# Patient Record
Sex: Male | Born: 1935 | Race: White | Hispanic: No
Health system: Southern US, Community
[De-identification: ages and names within clinical notes are randomized; demographics above are authoritative.]

## PROBLEM LIST (undated history)

## (undated) ENCOUNTER — Emergency Department (HOSPITAL_COMMUNITY): Admission: EM | Payer: Self-pay | Source: Home / Self Care

## (undated) DIAGNOSIS — K223 Perforation of esophagus: Secondary | ICD-10-CM

## (undated) DIAGNOSIS — I4891 Unspecified atrial fibrillation: Secondary | ICD-10-CM

## (undated) DIAGNOSIS — E785 Hyperlipidemia, unspecified: Secondary | ICD-10-CM

## (undated) DIAGNOSIS — I10 Essential (primary) hypertension: Secondary | ICD-10-CM

## (undated) HISTORY — PX: ESOPHAGEAL ATRESIA REPAIR: SHX1525

## (undated) HISTORY — DX: Hyperlipidemia, unspecified: E78.5

## (undated) HISTORY — DX: Essential (primary) hypertension: I10

## (undated) HISTORY — DX: Unspecified atrial fibrillation: I48.91

## (undated) HISTORY — DX: Perforation of esophagus: K22.3

## (undated) HISTORY — PX: TONSILLECTOMY: SUR1361

---

## 1998-07-08 ENCOUNTER — Ambulatory Visit (HOSPITAL_BASED_OUTPATIENT_CLINIC_OR_DEPARTMENT_OTHER): Admission: RE | Admit: 1998-07-08 | Discharge: 1998-07-08 | Payer: Self-pay | Admitting: Plastic Surgery

## 2006-01-29 ENCOUNTER — Emergency Department (HOSPITAL_COMMUNITY): Admission: EM | Admit: 2006-01-29 | Discharge: 2006-01-29 | Payer: Self-pay | Admitting: Emergency Medicine

## 2007-03-12 IMAGING — CR DG FINGER RING 2+V*R*
3 series · 3 of 3 positions shown · non-contrast
Comparison: none

HISTORY: Nau no skull, lacerations to middle and ring fingers

RIGHT MIDDLE FINGER 3 VIEWS:
Mildly displaced fractures identified at volar and dorsal aspects at base of
middle phalanx right middle finger.
PIP and DIP joint alignments remain normal.
No additional fracture or dislocation.

[x finger pa right]
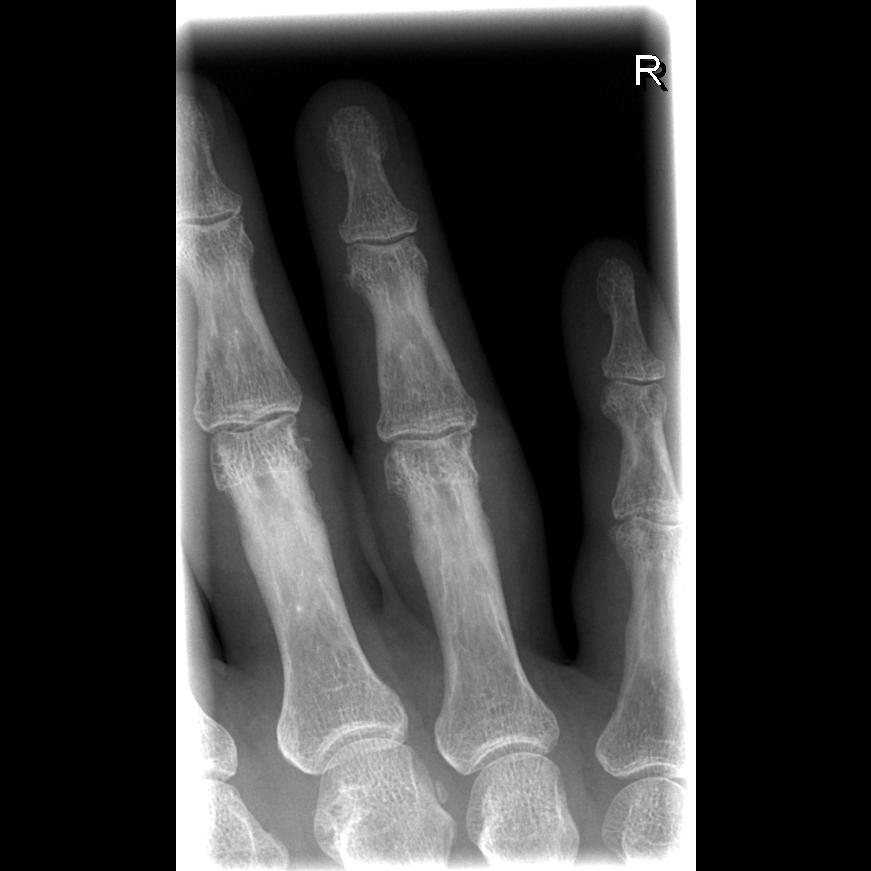

[x finger obl. right]
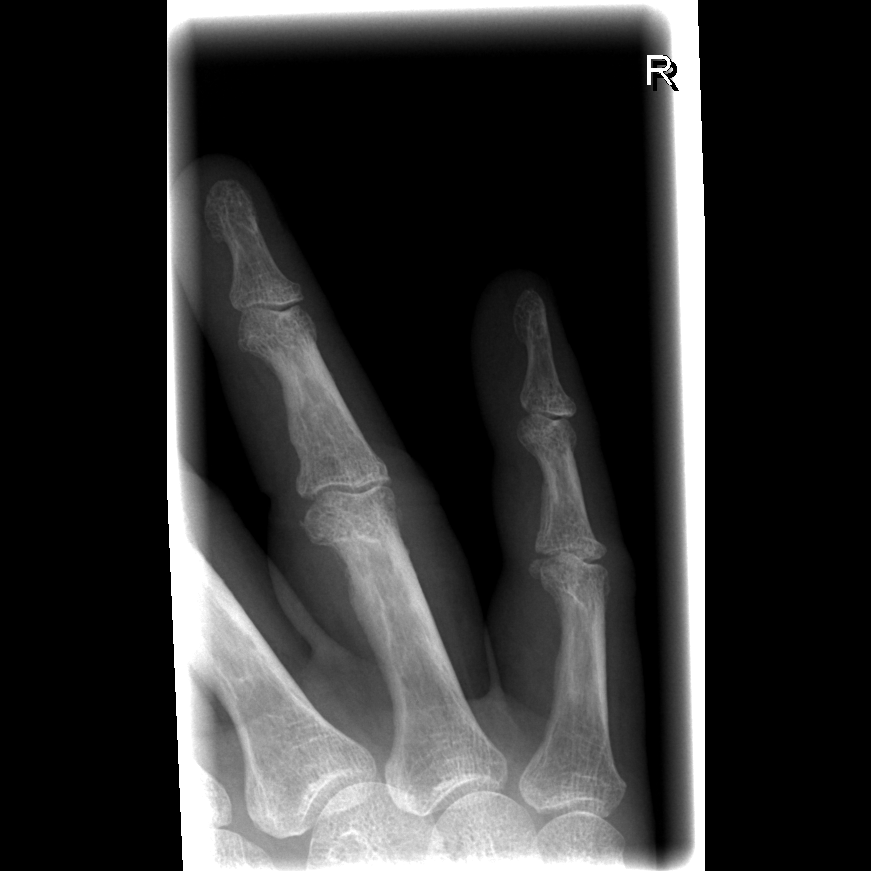

[x finger lateral right]
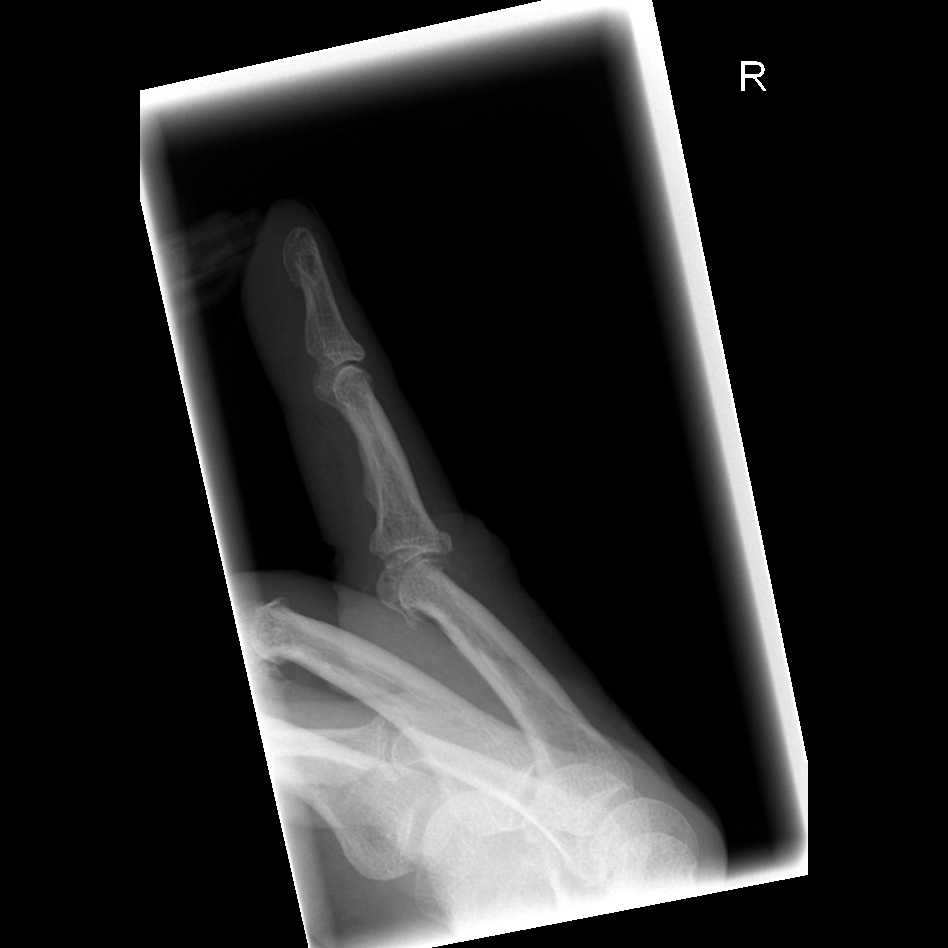

[3 of 3 positions shown; findings below may reference images not displayed]

IMPRESSION: Multiple fractures at base of middle phalanx, right middle finger.

RIGHT RING FINGER 3 VIEWS:

Mild degenerative changes at PIP joint.
Nondisplaced fracture dorsal margin of base of middle phalanx.
Deformity seen at volar aspect base of middle phalanx, suspicious for additional
fracture.
IMPRESSION: Nondisplaced fracture dorsal margin base of middle phalanx right ring finger.
Question additional fracture volar aspect base of middle phalanx  right ring
finger

## 2007-03-12 IMAGING — CR DG FINGER MIDDLE 2+V*R*
3 series · 3 of 3 positions shown · non-contrast
Comparison: none

HISTORY: Nau no skull, lacerations to middle and ring fingers

RIGHT MIDDLE FINGER 3 VIEWS:
Mildly displaced fractures identified at volar and dorsal aspects at base of
middle phalanx right middle finger.
PIP and DIP joint alignments remain normal.
No additional fracture or dislocation.

[x finger pa right]
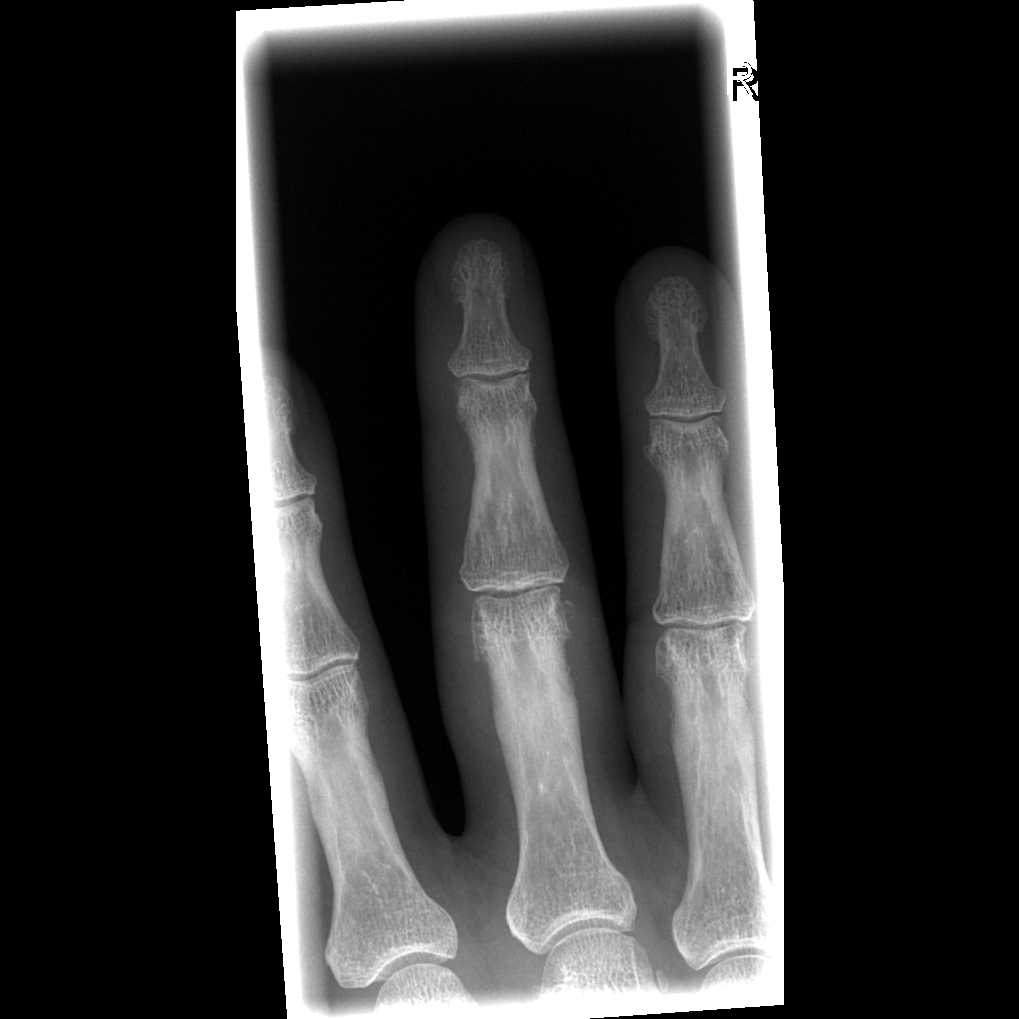

[x finger obl. right]
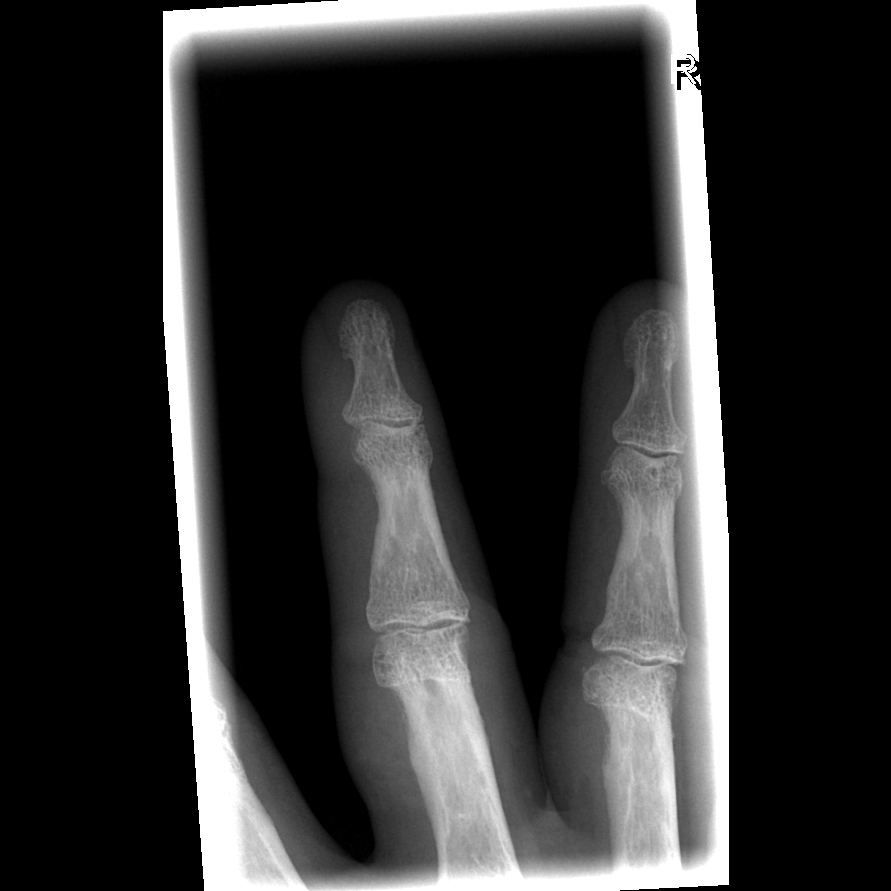

[x finger lateral right]
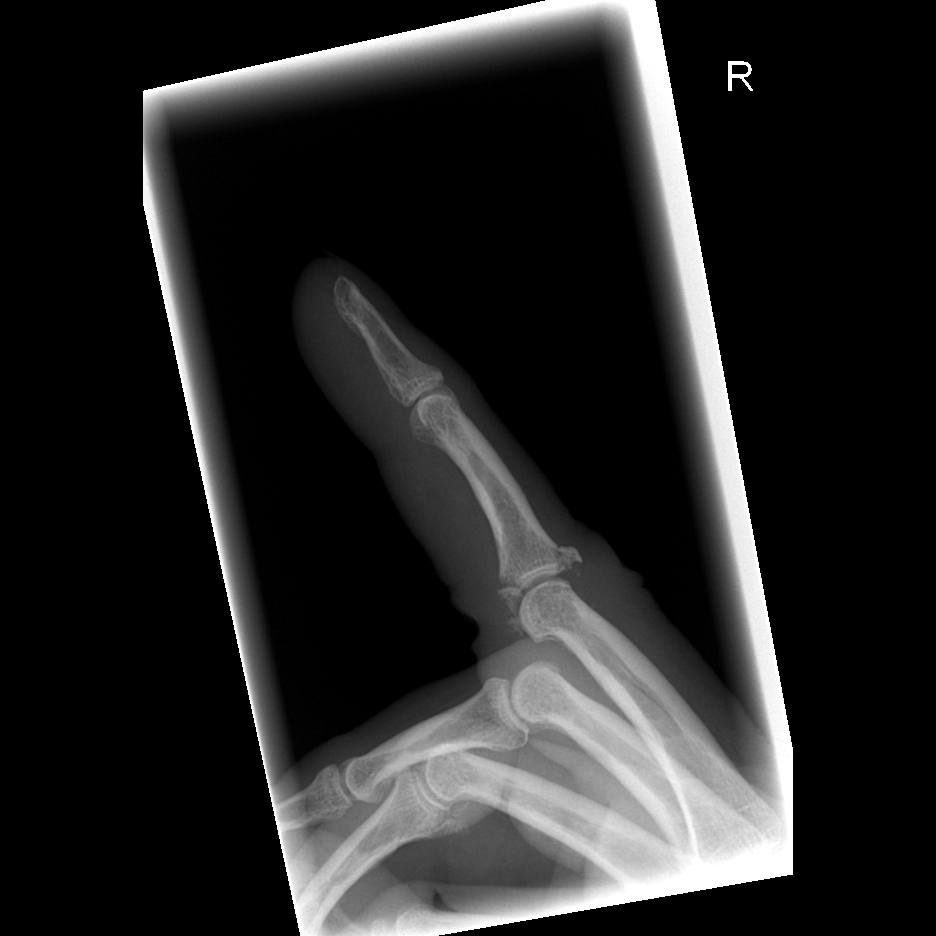

[3 of 3 positions shown; findings below may reference images not displayed]

IMPRESSION: Multiple fractures at base of middle phalanx, right middle finger.

RIGHT RING FINGER 3 VIEWS:

Mild degenerative changes at PIP joint.
Nondisplaced fracture dorsal margin of base of middle phalanx.
Deformity seen at volar aspect base of middle phalanx, suspicious for additional
fracture.
IMPRESSION: Nondisplaced fracture dorsal margin base of middle phalanx right ring finger.
Question additional fracture volar aspect base of middle phalanx  right ring
finger

## 2007-08-09 DIAGNOSIS — K223 Perforation of esophagus: Secondary | ICD-10-CM

## 2007-08-09 HISTORY — DX: Perforation of esophagus: K22.3

## 2013-01-01 ENCOUNTER — Encounter: Payer: Self-pay | Admitting: *Deleted

## 2013-01-02 ENCOUNTER — Ambulatory Visit (INDEPENDENT_AMBULATORY_CARE_PROVIDER_SITE_OTHER): Payer: Medicare Other | Admitting: Cardiovascular Disease

## 2013-01-02 ENCOUNTER — Encounter: Payer: Self-pay | Admitting: Cardiovascular Disease

## 2013-01-02 VITALS — BP 102/62 | HR 105 | Ht 74.0 in | Wt 176.0 lb

## 2013-01-02 DIAGNOSIS — E785 Hyperlipidemia, unspecified: Secondary | ICD-10-CM

## 2013-01-02 DIAGNOSIS — I4891 Unspecified atrial fibrillation: Secondary | ICD-10-CM

## 2013-01-02 DIAGNOSIS — I10 Essential (primary) hypertension: Secondary | ICD-10-CM

## 2013-01-02 DIAGNOSIS — I480 Paroxysmal atrial fibrillation: Secondary | ICD-10-CM | POA: Insufficient documentation

## 2013-01-02 NOTE — Progress Notes (Signed)
01/02/2013 Steve Blankenship   1934-07-27  [id number]  Primary Physician Londell Moh, MD Primary Cardiologist: Runell Gess MD Roseanne Reno   HPI:  The patient is a very pleasant, 79 year old, mildly overweight, widowed Caucasian male with no children who I saw a year ago. His risk factors include hypertension and hyperlipidemia. 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 He did have some paroxysmal atrial fibrillation and was on sotalol briefly and was placed back on a beta blocker. A Myoview stress test performed on August 10, 2009, was nonischemic. He denied any chest pain or shortness of breath. Dr. Renne Crigler checks his lipid profile.       Current Outpatient Prescriptions  Medication Sig Dispense Refill  . aspirin 81 MG tablet Take 81 mg by mouth daily.      . fish oil-omega-3 fatty acids 1000 MG capsule Take 2 g by mouth daily.      Marland Kitchen losartan (COZAAR) 50 MG tablet Take 50 mg by mouth daily.      . metoprolol tartrate (LOPRESSOR) 25 MG tablet Take 25 mg by mouth 2 (two) times daily.      . Multiple Vitamin (MULTIVITAMIN) capsule Take 1 capsule by mouth daily.      . rosuvastatin (CRESTOR) 20 MG tablet Take 20 mg by mouth daily.       No current facility-administered medications for this visit.    No Known Allergies  History   Social History  . Marital Status: Single    Spouse Name: N/A    Number of Children: N/A  . Years of Education: N/A   Occupational History  . Not on file.   Social History Main Topics  . Smoking status: Never Smoker   . Smokeless tobacco: Not on file  . Alcohol Use: Not on file  . Drug Use: Not on file  . Sexually Active: Not on file   Other Topics Concern  . Not on file   Social History Narrative  . No narrative on file     Review  of Systems: General: negative for chills, fever, night sweats or weight changes.  Cardiovascular: negative for chest pain, dyspnea on exertion, edema, orthopnea, palpitations, paroxysmal nocturnal dyspnea or shortness of breath Dermatological: negative for rash Respiratory: negative for cough or wheezing Urologic: negative for hematuria Abdominal: negative for nausea, vomiting, diarrhea, bright red blood per rectum, melena, or hematemesis Neurologic: negative for visual changes, syncope, or dizziness All other systems reviewed and are otherwise negative except as noted above.    Blood pressure 102/62, pulse 105, height 6\' 2"  (1.88 m), weight 176 lb (79.833 kg).  General appearance: alert and no distress Neck: no adenopathy, no carotid bruit, no JVD, supple, symmetrical, trachea midline and thyroid not enlarged, symmetric, no tenderness/mass/nodules Lungs: clear to auscultation bilaterally Heart: regular rate and rhythm, S1, S2 normal, no murmur, click, rub or gallop Extremities: extremities normal, atraumatic, no cyanosis or edema  EKG sinus tachycardia at 105 without ST or T wave changes  ASSESSMENT AND PLAN:   PAF (paroxysmal atrial fibrillation) Maintaining sinus rhythm on beta blocker without recurrence  Essential hypertension Well-controlled on current medications  Hyperlipidemia On statin therapy followed by his primary care physician      Runell Gess MD [facility], [facility] 01/02/2013 5:18 PM

## 2013-01-02 NOTE — Assessment & Plan Note (Signed)
On statin therapy followed by his primary care physician

## 2013-01-02 NOTE — Patient Instructions (Addendum)
Your physician wants you to follow-up in:  12 months.  You will receive a reminder letter in the mail two months in advance. If you don't receive a letter, please call our office to schedule the follow-up appointment.   

## 2013-01-02 NOTE — Assessment & Plan Note (Signed)
Maintaining sinus rhythm on beta blocker without recurrence

## 2013-01-02 NOTE — Assessment & Plan Note (Signed)
Well-controlled on current medications 

## 2013-01-18 ENCOUNTER — Other Ambulatory Visit: Payer: Self-pay | Admitting: Cardiovascular Disease

## 2013-05-19 ENCOUNTER — Other Ambulatory Visit: Payer: Self-pay | Admitting: Cardiovascular Disease

## 2013-05-20 NOTE — Telephone Encounter (Signed)
Rx was sent to pharmacy electronically. 

## 2015-03-04 DIAGNOSIS — E780 Pure hypercholesterolemia: Secondary | ICD-10-CM | POA: Diagnosis not present

## 2015-03-04 DIAGNOSIS — I10 Essential (primary) hypertension: Secondary | ICD-10-CM | POA: Diagnosis not present

## 2015-03-04 DIAGNOSIS — Z125 Encounter for screening for malignant neoplasm of prostate: Secondary | ICD-10-CM | POA: Diagnosis not present

## 2015-03-11 DIAGNOSIS — R5383 Other fatigue: Secondary | ICD-10-CM | POA: Diagnosis not present

## 2015-03-11 DIAGNOSIS — Z7982 Long term (current) use of aspirin: Secondary | ICD-10-CM | POA: Diagnosis not present

## 2015-03-11 DIAGNOSIS — Z87891 Personal history of nicotine dependence: Secondary | ICD-10-CM | POA: Diagnosis not present

## 2015-03-11 DIAGNOSIS — D692 Other nonthrombocytopenic purpura: Secondary | ICD-10-CM | POA: Diagnosis not present

## 2015-03-11 DIAGNOSIS — Z8719 Personal history of other diseases of the digestive system: Secondary | ICD-10-CM | POA: Diagnosis not present

## 2015-03-11 DIAGNOSIS — I10 Essential (primary) hypertension: Secondary | ICD-10-CM | POA: Diagnosis not present

## 2015-03-11 DIAGNOSIS — E780 Pure hypercholesterolemia: Secondary | ICD-10-CM | POA: Diagnosis not present

## 2015-03-11 DIAGNOSIS — Z1212 Encounter for screening for malignant neoplasm of rectum: Secondary | ICD-10-CM | POA: Diagnosis not present

## 2015-07-14 DIAGNOSIS — H2513 Age-related nuclear cataract, bilateral: Secondary | ICD-10-CM | POA: Diagnosis not present

## 2015-07-21 DIAGNOSIS — R1030 Lower abdominal pain, unspecified: Secondary | ICD-10-CM | POA: Diagnosis not present

## 2015-07-21 DIAGNOSIS — N390 Urinary tract infection, site not specified: Secondary | ICD-10-CM | POA: Diagnosis not present

## 2015-07-25 ENCOUNTER — Ambulatory Visit (INDEPENDENT_AMBULATORY_CARE_PROVIDER_SITE_OTHER): Payer: Medicare Other | Admitting: Family Medicine

## 2015-07-25 VITALS — BP 134/72 | HR 60 | Temp 98.0°F | Resp 16 | Ht 74.0 in

## 2015-07-25 DIAGNOSIS — T162XXA Foreign body in left ear, initial encounter: Secondary | ICD-10-CM

## 2015-07-25 DIAGNOSIS — S00412A Abrasion of left ear, initial encounter: Secondary | ICD-10-CM | POA: Diagnosis not present

## 2015-07-25 MED ORDER — NEOMYCIN-POLYMYXIN-HC 3.5-10000-1 OT SOLN: 3.0000 [drp] | Freq: Three times a day (TID) | OTIC | Status: DC

## 2015-07-25 NOTE — Progress Notes (Signed)
Subjective:  This chart was scribed for Merri Ray, MD by [facility], medical scribe at [facility].The patient was seen in exam room 04 and the patient's care was started at 3:14 PM.   Patient ID: Geannie Risen, male    DOB: 11/06/1933, 81 y.o.   MRN: [id number] Chief Complaint  Patient presents with  . Ear Problem    noticed a piece missing from his hearing aid and says its stuck in his left ear   HPI HPI Comments: LUKES MARANA is a 81 y.o. male who presents to [facility] because there is piece of his hearing aid stuck in his left ear. First noticed this two days ago. Left ear is somewhat uncomfortable. No cough, congestion, rhinorrhea.    Patient Active Problem List   Diagnosis Date Noted  . PAF (paroxysmal atrial fibrillation) ([place]) 01/02/2013  . Essential hypertension 01/02/2013  . Hyperlipidemia 01/02/2013   Past Medical History  Diagnosis Date  . HTN (hypertension)   . Hyperlipemia   . Esophageal rupture 2009  . A-fib [facility])     brief episode; myoview 08/10/09- normal, EF57%   History reviewed. No pertinent past surgical history. No Known Allergies Prior to Admission medications   Medication Sig Start Date End Date Taking? Authorizing Provider  aspirin 81 MG tablet Take 81 mg by mouth daily.   Yes Historical Provider, MD  fish oil-omega-3 fatty acids 1000 MG capsule Take 2 g by mouth daily.   Yes Historical Provider, MD  losartan (COZAAR) 50 MG tablet Take 50 mg by mouth daily.   Yes Historical Provider, MD  metoprolol tartrate (LOPRESSOR) 25 MG tablet TAKE 1 TABLET TWICE A DAY 05/19/13  Yes Lorretta Harp, MD  Multiple Vitamin (MULTIVITAMIN) capsule Take 1 capsule by mouth daily.   Yes Historical Provider, MD  rosuvastatin (CRESTOR) 20 MG tablet Take 20 mg by mouth daily.   Yes Historical Provider, MD   Social History   Social History  . Marital Status: Single    Spouse Name: N/A  . Number of Children: N/A  .  Years of Education: N/A   Occupational History  . Not on file.   Social History Main Topics  . Smoking status: Never Smoker   . Smokeless tobacco: Not on file  . Alcohol Use: Not on file  . Drug Use: Not on file  . Sexual Activity: Not on file   Other Topics Concern  . Not on file   Social History Narrative   Review of Systems  Constitutional: Negative for fever and chills.  HENT: Negative for congestion and rhinorrhea.   Respiratory: Negative for cough.   Neurological: Positive for light-headedness.      Objective:  BP 134/72 mmHg  Pulse 60  Temp(Src) 98 F (36.7 C) (Oral)  Resp 16  Ht 6\' 2"  (1.88 m)  SpO2 99% Physical Exam  Constitutional: He is oriented to person, place, and time. He appears well-developed and well-nourished. No distress.  HENT:  Head: Normocephalic and atraumatic.  Grommet for the hearing aid was removed in one piece from the left ear with alligator forceps through the otoscope. There is minimal erythema at the superior aspect of the canal. Slight abrasion, no bleeding, no complications.   Eyes: Pupils are equal, round, and reactive to light.  Neck: Normal range of motion.  Cardiovascular: Normal rate and regular rhythm.   Pulmonary/Chest: Effort normal. No respiratory distress.  Musculoskeletal: Normal range of motion.  Neurological: He is alert and oriented to person, place, and time.  Skin: Skin is warm and dry.  Psychiatric: He has a normal mood and affect. His behavior is normal.  Nursing note and vitals reviewed.     Assessment & Plan:   EDILSON GAN is a 81 y.o. male Abrasion of ear canal, left, initial encounter - Plan: neomycin-polymyxin-hydrocortisone (CORTISPORIN) otic solution  Foreign body in ear, left, initial encounter - Plan: neomycin-polymyxin-hydrocortisone (CORTISPORIN) otic solution  FB removed as above. No complications, but small abrasion/irritated canal noted. Will cover with cortisporin otic for 5 days, then can  replace hearing aid IF it appears to be intact by eval with his audiologist (advised to have them check out the aid first to make sure the new piece is fitting correctly). rtc precautions given.  Meds ordered this encounter  Medications  . neomycin-polymyxin-hydrocortisone (CORTISPORIN) otic solution    Sig: Place 3 drops into the left ear 3 (three) times daily.    Dispense:  10 mL    Refill:  0   Patient Instructions   There is a small abrasion where the piece of hearing aid was located. You can use the antibiotic drops 3 drops 3 times a day to left ear for the next 5 days, then can replace hearing aid as long as your audiologist is okay with you placing it back in the ear. Return to the clinic or go to the nearest emergency room if any of your symptoms worsen or new symptoms occur.       By signing my name below, I, Nadim Abuhashem, attest that this documentation has been prepared under the direction and in the presence of Merri Ray, MD.  Electronically Signed: Lora Havens, medical scribe. 07/25/2015, 3:31 PM.

## 2015-07-25 NOTE — Patient Instructions (Signed)
There is a small abrasion where the piece of hearing aid was located. You can use the antibiotic drops 3 drops 3 times a day to left ear for the next 5 days, then can replace hearing aid as long as your audiologist is okay with you placing it back in the ear. Return to the clinic or go to the nearest emergency room if any of your symptoms worsen or new symptoms occur.

## 2016-03-09 DIAGNOSIS — Z0000 Encounter for general adult medical examination without abnormal findings: Secondary | ICD-10-CM | POA: Diagnosis not present

## 2016-03-09 DIAGNOSIS — I10 Essential (primary) hypertension: Secondary | ICD-10-CM | POA: Diagnosis not present

## 2016-03-09 DIAGNOSIS — Z125 Encounter for screening for malignant neoplasm of prostate: Secondary | ICD-10-CM | POA: Diagnosis not present

## 2016-03-09 DIAGNOSIS — E7800 Pure hypercholesterolemia, unspecified: Secondary | ICD-10-CM | POA: Diagnosis not present

## 2016-03-16 DIAGNOSIS — I10 Essential (primary) hypertension: Secondary | ICD-10-CM | POA: Diagnosis not present

## 2016-03-16 DIAGNOSIS — E7800 Pure hypercholesterolemia, unspecified: Secondary | ICD-10-CM | POA: Diagnosis not present

## 2016-03-16 DIAGNOSIS — Z8719 Personal history of other diseases of the digestive system: Secondary | ICD-10-CM | POA: Diagnosis not present

## 2016-03-16 DIAGNOSIS — Z1212 Encounter for screening for malignant neoplasm of rectum: Secondary | ICD-10-CM | POA: Diagnosis not present

## 2016-03-16 DIAGNOSIS — K648 Other hemorrhoids: Secondary | ICD-10-CM | POA: Diagnosis not present

## 2017-03-09 DIAGNOSIS — E7800 Pure hypercholesterolemia, unspecified: Secondary | ICD-10-CM | POA: Diagnosis not present

## 2017-03-09 DIAGNOSIS — Z125 Encounter for screening for malignant neoplasm of prostate: Secondary | ICD-10-CM | POA: Diagnosis not present

## 2017-03-09 DIAGNOSIS — Z0000 Encounter for general adult medical examination without abnormal findings: Secondary | ICD-10-CM | POA: Diagnosis not present

## 2017-03-09 DIAGNOSIS — I10 Essential (primary) hypertension: Secondary | ICD-10-CM | POA: Diagnosis not present

## 2017-03-22 DIAGNOSIS — H9190 Unspecified hearing loss, unspecified ear: Secondary | ICD-10-CM | POA: Diagnosis not present

## 2017-03-22 DIAGNOSIS — Z1212 Encounter for screening for malignant neoplasm of rectum: Secondary | ICD-10-CM | POA: Diagnosis not present

## 2017-03-22 DIAGNOSIS — Z0001 Encounter for general adult medical examination with abnormal findings: Secondary | ICD-10-CM | POA: Diagnosis not present

## 2017-08-15 DIAGNOSIS — H2513 Age-related nuclear cataract, bilateral: Secondary | ICD-10-CM | POA: Diagnosis not present

## 2018-03-27 DIAGNOSIS — E7800 Pure hypercholesterolemia, unspecified: Secondary | ICD-10-CM | POA: Diagnosis not present

## 2018-03-27 DIAGNOSIS — Z125 Encounter for screening for malignant neoplasm of prostate: Secondary | ICD-10-CM | POA: Diagnosis not present

## 2018-03-27 DIAGNOSIS — Z7982 Long term (current) use of aspirin: Secondary | ICD-10-CM | POA: Diagnosis not present

## 2018-03-27 DIAGNOSIS — I10 Essential (primary) hypertension: Secondary | ICD-10-CM | POA: Diagnosis not present

## 2018-03-29 DIAGNOSIS — Z8719 Personal history of other diseases of the digestive system: Secondary | ICD-10-CM | POA: Diagnosis not present

## 2018-03-29 DIAGNOSIS — I10 Essential (primary) hypertension: Secondary | ICD-10-CM | POA: Diagnosis not present

## 2018-03-29 DIAGNOSIS — E7800 Pure hypercholesterolemia, unspecified: Secondary | ICD-10-CM | POA: Diagnosis not present

## 2018-03-29 DIAGNOSIS — L719 Rosacea, unspecified: Secondary | ICD-10-CM | POA: Diagnosis not present

## 2018-03-29 DIAGNOSIS — Z0001 Encounter for general adult medical examination with abnormal findings: Secondary | ICD-10-CM | POA: Diagnosis not present

## 2018-04-10 DIAGNOSIS — Z23 Encounter for immunization: Secondary | ICD-10-CM | POA: Diagnosis not present

## 2018-12-21 ENCOUNTER — Other Ambulatory Visit: Payer: Self-pay

## 2018-12-21 NOTE — Patient Outreach (Signed)
[facility]) Care Management  12/21/2018  Steve Blankenship 1933-12-13 [id number]   Medication Adherence call to Steve Blankenship HIPPA Compliant Voice message left with a call back number. Steve Blankenship is showing past due on Losartan 100 mg under [place].   [place] Management Direct Dial [phone]  Fax [phone] [email or web address]

## 2019-01-25 ENCOUNTER — Other Ambulatory Visit: Payer: Self-pay

## 2019-01-25 NOTE — Patient Outreach (Signed)
[facility]) Care Management  01/25/2019  Steve Blankenship Oct 16, 1933 [id number]   Medication Adherence call to Mr. Steve Blankenship Telephone call to Patient regarding Medication Adherence unable to reach patient. Mr. Steve Blankenship is showing past due on Losartan 100 mg under [place].   [place] Management Direct Dial [phone]  Fax [phone] [email or web address]

## 2019-04-01 DIAGNOSIS — I10 Essential (primary) hypertension: Secondary | ICD-10-CM | POA: Diagnosis not present

## 2019-04-01 DIAGNOSIS — Z0000 Encounter for general adult medical examination without abnormal findings: Secondary | ICD-10-CM | POA: Diagnosis not present

## 2019-04-04 DIAGNOSIS — R0600 Dyspnea, unspecified: Secondary | ICD-10-CM | POA: Diagnosis not present

## 2019-04-04 DIAGNOSIS — R29898 Other symptoms and signs involving the musculoskeletal system: Secondary | ICD-10-CM | POA: Diagnosis not present

## 2019-04-04 DIAGNOSIS — Z1212 Encounter for screening for malignant neoplasm of rectum: Secondary | ICD-10-CM | POA: Diagnosis not present

## 2019-04-04 DIAGNOSIS — Z0000 Encounter for general adult medical examination without abnormal findings: Secondary | ICD-10-CM | POA: Diagnosis not present

## 2019-04-04 DIAGNOSIS — Z23 Encounter for immunization: Secondary | ICD-10-CM | POA: Diagnosis not present

## 2019-04-04 DIAGNOSIS — E7800 Pure hypercholesterolemia, unspecified: Secondary | ICD-10-CM | POA: Diagnosis not present

## 2019-04-04 DIAGNOSIS — L719 Rosacea, unspecified: Secondary | ICD-10-CM | POA: Diagnosis not present

## 2019-04-04 DIAGNOSIS — I10 Essential (primary) hypertension: Secondary | ICD-10-CM | POA: Diagnosis not present

## 2019-04-04 DIAGNOSIS — R0602 Shortness of breath: Secondary | ICD-10-CM | POA: Diagnosis not present

## 2019-04-10 DIAGNOSIS — I517 Cardiomegaly: Secondary | ICD-10-CM | POA: Diagnosis not present

## 2019-04-10 DIAGNOSIS — R0600 Dyspnea, unspecified: Secondary | ICD-10-CM | POA: Diagnosis not present

## 2019-04-10 DIAGNOSIS — J90 Pleural effusion, not elsewhere classified: Secondary | ICD-10-CM | POA: Diagnosis not present

## 2019-04-10 DIAGNOSIS — Z8679 Personal history of other diseases of the circulatory system: Secondary | ICD-10-CM | POA: Diagnosis not present

## 2019-04-11 ENCOUNTER — Ambulatory Visit (INDEPENDENT_AMBULATORY_CARE_PROVIDER_SITE_OTHER): Payer: Medicare Other | Admitting: Cardiology

## 2019-04-11 ENCOUNTER — Other Ambulatory Visit: Payer: Self-pay

## 2019-04-11 ENCOUNTER — Encounter: Payer: Self-pay | Admitting: Cardiology

## 2019-04-11 VITALS — BP 158/83 | HR 62 | Ht 74.0 in | Wt 202.0 lb

## 2019-04-11 DIAGNOSIS — I480 Paroxysmal atrial fibrillation: Secondary | ICD-10-CM | POA: Diagnosis not present

## 2019-04-11 DIAGNOSIS — R4020 Unspecified coma: Secondary | ICD-10-CM

## 2019-04-11 DIAGNOSIS — I10 Essential (primary) hypertension: Secondary | ICD-10-CM

## 2019-04-11 NOTE — Progress Notes (Signed)
Patient referred by Steve Pretty, MD for dyspnea on exertion, syncope.   Subjective:   Steve Blankenship, male    DOB: July 18, 1934, 85 y.o.   MRN: [id number]   Chief Complaint  Patient presents with  . Loss of Consciousness    HPI  85 y.o. Caucasian male with hypertension, hyperlipidemia, h/o spontaneous rupture of esophagus, (2010), referred for evaluation of loss of consciousness.   Patient lives alone. 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 Prior to that, he was in the [employer] On July 7, patient members being at home eating lunch.  He had loss of consciousness for unclear reason.  He found himself down on the floor with his glasses shattered and bruised.  He had difficulty getting up from the floor.  He called EMS, who visited the patient, but patient opted not to get hospitalized.  He has not had any such episodes since then.  He does not recollect feeling any chest pain, shortness of breath, lightheadedness, palpitation prior to the episode.  He denies any of these symptoms before or after the episode.  He has been cautious since his fall, but used to be quite active prior to that.  He underwent echocardiogram and chest x-ray at his PCP Dr. Lambert Blankenship office, details below.  Patient has prior history of spontaneous esophageal rupture requiring surgery and prolonged hospital stay back in 2010.   Past Medical History:  Diagnosis Date  . A-fib [facility])    brief episode; myoview 08/10/09- normal, EF57%  . Esophageal rupture 2009  . HTN (hypertension)   . Hyperlipemia      Past Surgical History:  Procedure Laterality Date  . ESOPHAGEAL ATRESIA REPAIR    . TONSILLECTOMY       Social History   Socioeconomic History  . Marital status: Single    Spouse name: Not on file  . Number of children: Not on file  . Years of education: Not on file  . Highest education level: Not on file  Occupational History  . Not on file  Social Needs  . Financial resource  strain: Not on file  . Food insecurity    Worry: Not on file    Inability: Not on file  . Transportation needs    Medical: Not on file    Non-medical: Not on file  Tobacco Use  . Smoking status: Never Smoker  Substance and Sexual Activity  . Alcohol use: Not on file  . Drug use: Not on file  . Sexual activity: Not on file  Lifestyle  . Physical activity    Days per week: Not on file    Minutes per session: Not on file  . Stress: Not on file  Relationships  . Social Herbalist on phone: Not on file    Gets together: Not on file    Attends religious service: Not on file    Active member of club or organization: Not on file    Attends meetings of clubs or organizations: Not on file    Relationship status: Not on file  . Intimate partner violence    Fear of current or ex partner: Not on file    Emotionally abused: Not on file    Physically abused: Not on file    Forced sexual activity: Not on file  Other Topics Concern  . Not on file  Social History Narrative  . Not on file     Family History  Problem Relation Age of Onset  . Heart disease Mother      Current Outpatient Medications on File Prior to Visit  Medication Sig Dispense Refill  . aspirin 81 MG tablet Take 81 mg by mouth daily.    . fish oil-omega-3 fatty acids 1000 MG capsule Take 2 g by mouth daily.    Marland Kitchen losartan (COZAAR) 50 MG tablet Take 50 mg by mouth daily.    . metoprolol tartrate (LOPRESSOR) 25 MG tablet TAKE 1 TABLET TWICE A DAY 60 tablet 7  . Multiple Vitamin (MULTIVITAMIN) capsule Take 1 capsule by mouth daily.    Marland Kitchen neomycin-polymyxin-hydrocortisone (CORTISPORIN) otic solution Place 3 drops into the left ear 3 (three) times daily. 10 mL 0  . rosuvastatin (CRESTOR) 20 MG tablet Take 20 mg by mouth daily.     No current facility-administered medications on file prior to visit.     Cardiovascular studies:  Outside echocardiogram 04-19-19: LV size.  Mildly increased thickness. EF  55-60%.  Normal diastolic function. Normal RV size and function. Mild left atrial dilatation. Aortic valve sclerosis without stenosis. Trace mitral, mild tricuspid regurgitation.  RVSP 20-30 mmHg. Aortic root normal.  Chest x-ray 04/04/2019: No active findings identified.  Chronic appearing pleural and parenchymal scarring in both lower chest regions.  Left worse than right with old healed rib fractures on the left indicating previous chest trauma.  EKG 04/04/2019: Sinus rhythm 57 bpm.  Normal EKG.    Recent labs: 04/01/2019: Glucose 104. BUN/Cr 11/0.72. eGFR 86. Na/K 140/4.3. Rest of the CMP normal. H/H 15/44. MCV 93. Plateletes 182. Chol 149, TG 104, HDL 58, LDL 70.   Review of Systems  Constitution: Negative for decreased appetite, malaise/fatigue, weight gain and weight loss.  HENT: Negative for congestion.   Eyes: Negative for visual disturbance.  Cardiovascular: Negative for chest pain, dyspnea on exertion, leg swelling, palpitations and syncope.  Respiratory: Negative for cough.   Endocrine: Negative for cold intolerance.  Hematologic/Lymphatic: Does not bruise/bleed easily.  Skin: Negative for itching and rash.  Musculoskeletal: Negative for myalgias.  Gastrointestinal: Negative for abdominal pain, nausea and vomiting.  Genitourinary: Negative for dysuria.  Neurological: Negative for dizziness and weakness.       Loss of consciousness 02/12/2019.  Psychiatric/Behavioral: The patient is not nervous/anxious.   All other systems reviewed and are negative.        Vitals:   04/11/19 1117 04/11/19 1132  BP: (!) 186/82 (!) 158/83  Pulse: 60 62  SpO2:       Body mass index is 25.94 kg/m. Filed Weights   04/11/19 1043  Weight: 202 lb (91.6 kg)     Objective:   Physical Exam  Constitutional: He is oriented to person, place, and time. He appears well-developed and well-nourished. No distress.  HENT:  Head: Normocephalic and atraumatic.  Eyes: Pupils are equal,  round, and reactive to light. Conjunctivae are normal.  Neck: No JVD present.  Cardiovascular: Normal rate, regular rhythm and intact distal pulses.  Pulmonary/Chest: Effort normal and breath sounds normal. He has no wheezes. He has no rales.  Abdominal: Soft. Bowel sounds are normal. There is no rebound.  Musculoskeletal:        General: No edema.  Lymphadenopathy:    He has no cervical adenopathy.  Neurological: He is alert and oriented to person, place, and time. No cranial nerve deficit.  Skin: Skin is warm and dry.  Psychiatric: He has a normal mood and affect.  Nursing note and vitals reviewed.  Assessment & Recommendations:   85 y.o. Caucasian male with hypertension, hyperlipidemia, h/o spontaneous rupture of esophagus, (2010), referred for evaluation of loss of consciousness.   Loss of consciousness: Unclear if this was true syncope or a mechanical fall that led to loss of consciousness.  Based on patient's recollection of the events, and absence of any episodes before or after, it is possible that it truly was a mechanical fall.  His physical exam, EKG are normal.  I reviewed the reports of chest x-ray and echocardiogram performed at Dr. Pennie Banter office.  There is no pleural effusion on chest x-ray, although there is some old scarring and old healed rib fractures seen.  Echocardiogram is fairly unremarkable except for mild left atrial dilatation and mild aortic sclerosis.  It is possible that he may have paroxysmal atrial fibrillation, given mild left atrial dilatation.  Even then, possibility of syncope related to A. fib RVR remains remote.  I discussed with the patient regarding placing an event monitor.  However, patient wanted to pursue conservative approach at this time.  Should he have any recurrence of any such event, would certainly feel strongly about event monitor then.  After completing today's visit with the patient, I came across prior notes from a visit with Dr.  Donnella Bi.  He did mention regarding patient having paroxysmal atrial fibrillation after his esophageal rupture surgery in 2011.  This becomes all the more important now given his loss of consciousness.  I discussed with the patient again regarding placing an event monitor to find out if he still has paroxysmal atrial fibrillation.  If so, he would likely need anticoagulation.  Patient would like me to find out regarding the cost of the monitor.  I have asked my office to follow-up on this.  I will see the patient back in 3 months.  Hypertension: Blood pressure was elevated today on initial check, improved thereafter.  His blood pressure was normal at Dr. Pennie Banter office.  I have not made any changes to his medications.  Recommend continued home blood pressure monitoring.   Thank you for referring the patient to [place]. Please feel free to contact with any questions.  Nigel Mormon, MD [facility] Pager: [phone] Office: [phone] If no answer Cell [phone]  -----------------------------------------------------------------------------------------------------------------------------

## 2019-05-07 DIAGNOSIS — R5381 Other malaise: Secondary | ICD-10-CM | POA: Diagnosis not present

## 2019-05-07 DIAGNOSIS — R55 Syncope and collapse: Secondary | ICD-10-CM | POA: Diagnosis not present

## 2019-05-07 DIAGNOSIS — R9389 Abnormal findings on diagnostic imaging of other specified body structures: Secondary | ICD-10-CM | POA: Diagnosis not present

## 2019-05-07 DIAGNOSIS — Z8679 Personal history of other diseases of the circulatory system: Secondary | ICD-10-CM | POA: Diagnosis not present

## 2019-05-17 ENCOUNTER — Institutional Professional Consult (permissible substitution): Payer: Self-pay | Admitting: Internal Medicine

## 2019-06-06 DIAGNOSIS — R55 Syncope and collapse: Secondary | ICD-10-CM | POA: Diagnosis not present

## 2019-06-06 DIAGNOSIS — I10 Essential (primary) hypertension: Secondary | ICD-10-CM | POA: Diagnosis not present

## 2019-06-11 DIAGNOSIS — R55 Syncope and collapse: Secondary | ICD-10-CM | POA: Diagnosis not present

## 2019-09-19 DIAGNOSIS — R55 Syncope and collapse: Secondary | ICD-10-CM | POA: Diagnosis not present

## 2019-10-07 ENCOUNTER — Other Ambulatory Visit: Payer: Self-pay

## 2019-10-07 ENCOUNTER — Encounter: Payer: Self-pay | Admitting: Cardiology

## 2019-10-07 ENCOUNTER — Ambulatory Visit: Payer: Medicare Other | Admitting: Cardiology

## 2019-10-07 VITALS — BP 151/83 | HR 75 | Temp 97.3°F | Resp 16 | Ht 74.0 in | Wt 195.0 lb

## 2019-10-07 DIAGNOSIS — I10 Essential (primary) hypertension: Secondary | ICD-10-CM

## 2019-10-07 DIAGNOSIS — R4020 Unspecified coma: Secondary | ICD-10-CM

## 2019-10-07 DIAGNOSIS — I471 Supraventricular tachycardia: Secondary | ICD-10-CM | POA: Diagnosis not present

## 2019-10-07 DIAGNOSIS — I480 Paroxysmal atrial fibrillation: Secondary | ICD-10-CM | POA: Diagnosis not present

## 2019-10-07 MED ORDER — METOPROLOL TARTRATE 25 MG PO TABS: 25.0000 mg | ORAL_TABLET | Freq: Three times a day (TID) | ORAL | 1 refills | Status: DC

## 2019-10-07 NOTE — Progress Notes (Signed)
Patient referred by [name], MD for dyspnea on exertion, syncope.   Subjective:   Steve Blankenship, male    DOB: 1933-12-16, 86 y.o.   MRN: [id number]   Chief Complaint  Patient presents with  . Follow-up    SVT  . [place]    HPI  86 y.o. Caucasian male with hypertension, hyperlipidemia, h/o spontaneous rupture of esophagus (2010), paroxysmal Afib, PSVT, h/o syncope.  His stated A. fib episode was in 2011, after his esophageal surgery.  There is no documented evidence of A. fib since then.  He has not been on anticoagulation before.  Recently, he underwent event monitor through his PCP, which showed brief episodes of SVT.  Patient denies any palpitations.  He has not had any subsequent syncope episodes.  Initial consultation HPI 04/2019: Patient lives alone. 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 Prior to that, he was in the [employer] On July 7, patient members being at home eating lunch.  He had loss of consciousness for unclear reason.  He found himself down on the floor with his glasses shattered and bruised.  He had difficulty getting up from the floor.  He called EMS, who visited the patient, but patient opted not to get hospitalized.  He has not had any such episodes since then.  He does not recollect feeling any chest pain, shortness of breath, lightheadedness, palpitation prior to the episode.  He denies any of these symptoms before or after the episode.  He has been cautious since his fall, but used to be quite active prior to that.  He underwent echocardiogram and chest x-ray at his PCP Dr. Lambert Mody office, details below.  Patient has prior history of spontaneous esophageal rupture requiring surgery and prolonged hospital stay back in 2010.    Current Outpatient Medications on File Prior to Visit  Medication Sig Dispense Refill  . aspirin 81 MG tablet Take 81 mg by mouth daily.    . fish oil-omega-3 fatty acids 1000 MG capsule Take 2 g by mouth  daily.    Marland Kitchen losartan (COZAAR) 100 MG tablet Take 100 mg by mouth daily.    . metoprolol tartrate (LOPRESSOR) 25 MG tablet TAKE 1 TABLET TWICE A DAY 60 tablet 7  . Multiple Vitamin (MULTIVITAMIN) capsule Take 1 capsule by mouth daily.    Marland Kitchen neomycin-polymyxin-hydrocortisone (CORTISPORIN) otic solution Place 3 drops into the left ear 3 (three) times daily. 10 mL 0  . psyllium (METAMUCIL SMOOTH TEXTURE) 28 % packet Take 1 packet by mouth 2 (two) times daily.    . rosuvastatin (CRESTOR) 20 MG tablet Take 20 mg by mouth daily.     No current facility-administered medications on file prior to visit.    Cardiovascular studies:  Event monitor 05/07/2019 - 05/21/2019: Dominant rhythm: Sinus. HR 41-218 bpm. Avg HR 65 bpm. <1% SVT burden, fastest at 218 bpm for 6 beats.  No atrial fibrillation/atrial flutter/SVT/VT/high grade AV block, sinus pause >3sec noted. Symptoms reported: None Symptoms correlated with .   EKG 10/07/2019: Sinus rhythm 70 bpm, sinus arhrtymia.   Outside echocardiogram 04/10/2019: LV size.  Mildly increased thickness. EF 55-60%.  Normal diastolic function. Normal RV size and function. Mild left atrial dilatation. Aortic valve sclerosis without stenosis. Trace mitral, mild tricuspid regurgitation.  RVSP 20-30 mmHg. Aortic root normal.  Chest x-ray 04/04/2019: No active findings identified.  Chronic appearing pleural and parenchymal scarring in both lower chest regions.  Left worse than right with  old healed rib fractures on the left indicating previous chest trauma.  EKG 04/04/2019: Sinus rhythm 57 bpm.  Normal EKG.    Recent labs: 04/01/2019: Glucose 104. BUN/Cr 11/0.72. eGFR 86. Na/K 140/4.3. Rest of the CMP normal. H/H 15/44. MCV 93. Plateletes 182. Chol 149, TG 104, HDL 58, LDL 70.   Review of Systems  Cardiovascular: Negative for chest pain, dyspnea on exertion, leg swelling, palpitations and syncope.         Vitals:   10/07/19 1121  BP: (!) 151/83    Pulse: 75  Resp: 16  Temp: (!) 97.3 F (36.3 C)  SpO2: 98%     Body mass index is 25.04 kg/m.mpatwardhan Filed Weights   10/07/19 1121  Weight: 195 lb (88.5 kg)     Objective:   Physical Exam  Constitutional: He appears well-developed and well-nourished.  Neck: No JVD present.  Cardiovascular: Normal rate, regular rhythm, normal heart sounds and intact distal pulses.  No murmur heard. Pulmonary/Chest: Effort normal and breath sounds normal. He has no wheezes. He has no rales.  Musculoskeletal:        General: No edema.  Nursing note and vitals reviewed.         Assessment & Recommendations:   86 y.o. Caucasian male with hypertension, hyperlipidemia, h/o spontaneous rupture of esophagus (2010), paroxysmal Afib, PSVT, h/o syncope.  PSVT: Essentially asymptomatic, but low PSVT burden.  I will increase his metoprolol to 25 mg 3 times daily.  If he tolerates well, will increase to 50 mg twice daily.  This will control his blood pressure better, and could potentially also reduce his PSVT episodes.  Paroxysmal A. Fib: Documented in 2011 after his sister rupture surgery, with no recurrence.  He has not been on anticoagulation all this well.  There has been no new evidence of paroxysmal A. fib.  Thus, we have mutually decided to hold off on anticoagulation.  Hypertension: Uncontrolled.  Increased metoprolol tartrate to 25 mg 3 times daily.  Will increase to 50 mg daily, as tolerated.    Follow-up in 4 weeks.  Nigel Mormon, MD [facility]. [place] Pager: [phone] Office: [phone] If no answer Cell [phone]

## 2019-11-05 ENCOUNTER — Encounter: Payer: Self-pay | Admitting: Cardiology

## 2019-11-07 ENCOUNTER — Other Ambulatory Visit: Payer: Self-pay

## 2019-11-07 ENCOUNTER — Encounter: Payer: Self-pay | Admitting: Cardiology

## 2019-11-07 ENCOUNTER — Ambulatory Visit: Payer: Medicare Other | Admitting: Cardiology

## 2019-11-07 VITALS — BP 179/87 | HR 79 | Temp 98.0°F | Resp 18 | Ht 74.0 in | Wt 200.0 lb

## 2019-11-07 DIAGNOSIS — I471 Supraventricular tachycardia: Secondary | ICD-10-CM | POA: Diagnosis not present

## 2019-11-07 DIAGNOSIS — I10 Essential (primary) hypertension: Secondary | ICD-10-CM | POA: Diagnosis not present

## 2019-11-07 MED ORDER — METOPROLOL TARTRATE 50 MG PO TABS: 25.0000 mg | ORAL_TABLET | Freq: Two times a day (BID) | ORAL | 2 refills | Status: DC

## 2019-11-07 NOTE — Progress Notes (Signed)
Patient referred by [name], MD for dyspnea on exertion, syncope.   Subjective:   Steve Blankenship, male    DOB: March 09, 1934, 86 y.o.   MRN: [id number]   Chief Complaint  Patient presents with  . Atrial Fibrillation  . psvt  . Follow-up    4 week    Atrial Fibrillation Symptoms are negative for chest pain, palpitations and syncope. Past medical history includes atrial fibrillation.    85 y.o. Caucasian male with hypertension, hyperlipidemia, h/o spontaneous rupture of esophagus (2010), paroxysmal Afib, PSVT, h/o syncope.  His stated A. fib episode was in 2011, after his esophageal surgery.  There is no documented evidence of A. fib since then.  He has not been on anticoagulation before.  Recently, he underwent event monitor through his PCP, which showed brief episodes of SVT.  Patient denies any palpitations.  He has not had any subsequent syncope episodes. Blood pressure remains elevated.   Initial consultation HPI 04/2019: Patient lives alone. 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 Prior to that, he was in the [employer]. On July 7, patient members being at home eating lunch.  He had loss of consciousness for unclear reason.  He found himself down on the floor with his glasses shattered and bruised.  He had difficulty getting up from the floor.  He called EMS, who visited the patient, but patient opted not to get hospitalized.  He has not had any such episodes since then.  He does not recollect feeling any chest pain, shortness of breath, lightheadedness, palpitation prior to the episode.  He denies any of these symptoms before or after the episode.  He has been cautious since his fall, but used to be quite active prior to that.  He underwent echocardiogram and chest x-ray at his PCP Dr. Lambert Mody office, details below.  Patient has prior history of spontaneous esophageal rupture requiring surgery and prolonged hospital stay back in 2010.    Current Outpatient  Medications on File Prior to Visit  Medication Sig Dispense Refill  . Boswellia-Glucosamine-Vit D (OSTEO BI-FLEX ONE PER DAY PO) Take by mouth.    . co-enzyme Q-10 30 MG capsule Take 30 mg by mouth 3 (three) times daily.    Marland Kitchen losartan (COZAAR) 100 MG tablet Take 100 mg by mouth daily.    . metoprolol tartrate (LOPRESSOR) 25 MG tablet Take 1 tablet (25 mg total) by mouth in the morning, at noon, and at bedtime. 90 tablet 1  . naproxen sodium (ALEVE) 220 MG tablet Take 220 mg by mouth.    . neomycin-polymyxin-hydrocortisone (CORTISPORIN) otic solution Place 3 drops into the left ear 3 (three) times daily. 10 mL 0  . psyllium (METAMUCIL SMOOTH TEXTURE) 28 % packet Take 1 packet by mouth 2 (two) times daily.    . rosuvastatin (CRESTOR) 20 MG tablet Take 20 mg by mouth daily.     No current facility-administered medications on file prior to visit.    Cardiovascular studies:  Event monitor 05/07/2019 - 05/21/2019: Dominant rhythm: Sinus. HR 41-218 bpm. Avg HR 65 bpm. <1% SVT burden, fastest at 218 bpm for 6 beats.  No atrial fibrillation/atrial flutter/SVT/VT/high grade AV block, sinus pause >3sec noted. Symptoms reported: None Symptoms correlated with .   EKG 10/07/2019: Sinus rhythm 70 bpm, sinus arhrtymia.   Outside echocardiogram 04/10/2019: LV size.  Mildly increased thickness. EF 55-60%.  Normal diastolic function. Normal RV size and function. Mild left atrial dilatation. Aortic valve  sclerosis without stenosis. Trace mitral, mild tricuspid regurgitation.  RVSP 20-30 mmHg. Aortic root normal.  Chest x-ray 04/04/2019: No active findings identified.  Chronic appearing pleural and parenchymal scarring in both lower chest regions.  Left worse than right with old healed rib fractures on the left indicating previous chest trauma.  EKG 04/04/2019: Sinus rhythm 57 bpm.  Normal EKG.    Recent labs: 04/01/2019: Glucose 104. BUN/Cr 11/0.72. eGFR 86. Na/K 140/4.3. Rest of the CMP  normal. H/H 15/44. MCV 93. Plateletes 182. Chol 149, TG 104, HDL 58, LDL 70.   Review of Systems  Constitution: Positive for malaise/fatigue.  Cardiovascular: Negative for chest pain, dyspnea on exertion, leg swelling, palpitations and syncope.  Musculoskeletal: Positive for joint pain.         Vitals:   11/07/19 1129  BP: (!) 179/87  Pulse: 79  Resp: 18  Temp: 98 F (36.7 C)  SpO2: 99%     Body mass index is 25.68 kg/m. Filed Weights   11/07/19 1129  Weight: 200 lb (90.7 kg)     Objective:   Physical Exam  Constitutional: He appears well-developed and well-nourished.  Neck: No JVD present.  Cardiovascular: Normal rate, regular rhythm, normal heart sounds and intact distal pulses.  No murmur heard. Pulmonary/Chest: Effort normal and breath sounds normal. He has no wheezes. He has no rales.  Musculoskeletal:        General: No edema.  Nursing note and vitals reviewed.         Assessment & Recommendations:   86 y.o. Caucasian male with hypertension, hyperlipidemia, h/o spontaneous rupture of esophagus (2010), paroxysmal Afib, PSVT, h/o syncope.  PSVT: Essentially asymptomatic, but low PSVT burden.  Given elevated blood pressur,e increased metoprolol tartarate to 50 mg bid,  Paroxysmal A. Fib: Documented in 2011 after his sister rupture surgery, with no recurrence.  He has not been on anticoagulation all this while.  There has been no new evidence of paroxysmal A. fib.  Thus, we have mutually decided to hold off on anticoagulation.  Hypertension: Uncontrolled.  Increased metoprolol tartrate to 50 mg bid. Enrolled in remote patient monitoring.  Follow-up in 6 months.  Nigel Mormon, MD [facility] Pager: [phone] Office: [phone] If no answer Cell [phone]

## 2019-11-11 ENCOUNTER — Telehealth: Payer: Self-pay | Admitting: Pharmacist

## 2019-11-11 NOTE — Telephone Encounter (Signed)
Pt called to verify his medications and dose for his antihypertensive medications. Pt states that he was told from the pharmacy label that he should be taking metoprolol tartrate 25 mg BID instead of increasing his dose to 50 mg daily as discussed during OV w/ Dr. Virgina Jock. OV note from 4/1 confirms the 50 mg dose increase. Pt also states that he has been taking 50 mg losartan dose for a while. Med list states that pt has been on losartan 100 mg. RPM BP readings reviewed. BP elevated and not a goal. SBP around 150s. Reviewed and updated pt's med list together and encourage pt to take losartan 100 mg as prescribed. Will continue to monitor and follow up as needed.

## 2019-12-06 DIAGNOSIS — I10 Essential (primary) hypertension: Secondary | ICD-10-CM | POA: Diagnosis not present

## 2019-12-12 ENCOUNTER — Telehealth: Payer: Self-pay

## 2019-12-12 DIAGNOSIS — I471 Supraventricular tachycardia: Secondary | ICD-10-CM

## 2019-12-12 MED ORDER — METOPROLOL TARTRATE 50 MG PO TABS: 50.0000 mg | ORAL_TABLET | Freq: Two times a day (BID) | ORAL | 3 refills | Status: DC

## 2019-12-12 NOTE — Telephone Encounter (Signed)
Spoke with Lennette Bihari at [facility] [employer], verified Metoprolol 50 twice daily.

## 2019-12-12 NOTE — Telephone Encounter (Signed)
Tried to return Steve Blankenship's call from [place] at 11:41. He was at lunch. We will try back later.

## 2020-01-03 DIAGNOSIS — I10 Essential (primary) hypertension: Secondary | ICD-10-CM | POA: Diagnosis not present

## 2020-02-05 DIAGNOSIS — I10 Essential (primary) hypertension: Secondary | ICD-10-CM | POA: Diagnosis not present

## 2020-03-07 DIAGNOSIS — I10 Essential (primary) hypertension: Secondary | ICD-10-CM | POA: Diagnosis not present

## 2020-04-02 DIAGNOSIS — E7800 Pure hypercholesterolemia, unspecified: Secondary | ICD-10-CM | POA: Diagnosis not present

## 2020-04-02 DIAGNOSIS — I10 Essential (primary) hypertension: Secondary | ICD-10-CM | POA: Diagnosis not present

## 2020-04-07 DIAGNOSIS — I10 Essential (primary) hypertension: Secondary | ICD-10-CM | POA: Diagnosis not present

## 2020-04-09 DIAGNOSIS — D171 Benign lipomatous neoplasm of skin and subcutaneous tissue of trunk: Secondary | ICD-10-CM | POA: Diagnosis not present

## 2020-04-09 DIAGNOSIS — Z0001 Encounter for general adult medical examination with abnormal findings: Secondary | ICD-10-CM | POA: Diagnosis not present

## 2020-04-09 DIAGNOSIS — R5383 Other fatigue: Secondary | ICD-10-CM | POA: Diagnosis not present

## 2020-04-30 ENCOUNTER — Ambulatory Visit: Payer: Self-pay | Admitting: General Surgery

## 2020-04-30 DIAGNOSIS — D171 Benign lipomatous neoplasm of skin and subcutaneous tissue of trunk: Secondary | ICD-10-CM | POA: Diagnosis not present

## 2020-04-30 NOTE — H&P (Signed)
Steve Blankenship Appointment: 04/30/2020 1:30 PM Location: [facility] Patient #: [id number] DOB: Jun 25, 1934 Single / Language: Steve Blankenship / Race: White Male  History of Present Illness Steve Hiss M. Kareem Cathey MD; 04/30/2020 1:40 PM) The patient is a 86 year old male who presents with a soft tissue mass. He is referred by Dr Shelia Media evaluation of an upper back lipoma. He states that it is been present for many years. He believes it slightly increased in size. He denies any trauma, drainage, redness to the area. He denies any complaint weight loss. He denies any lymphadenopathy. He denies any family history of soft tissue malignancies. He denies any smoking. He states that is becoming more noticeable since it is a lump on his upper back. It will cause some occasional discomfort. He is widowed. He lives alone. He has no immediate family. Otherwise she is very healthy. He takes medicine for cholesterol and hypertension. No chest pain, angina, TIAs or amaurosis fugax.   Problem List/Past Medical Steve Hiss M. Redmond Pulling, MD; 04/30/2020 1:40 PM) LIPOMA OF BACK (D17.1)  Allergies (Steve Blankenship, Webb; 04/30/2020 1:22 PM) No Known Drug Allergies [04/30/2020]: Allergies Reconciled  Medication History (Steve Blankenship, [name]; 04/30/2020 1:22 PM) Losartan Potassium (100MG  Tablet, Oral) Active. Metoprolol Succinate ER (50MG  Tablet ER 24HR, Oral) Active. Rosuvastatin Calcium (20MG  Tablet, Oral) Active. Medications Reconciled  Other Problems Steve Hiss M. Redmond Pulling, MD; 04/30/2020 1:40 PM) Hypercholesterolemia     Review of Systems Steve Hiss M. [name]; 04/30/2020 1:39 PM) Respiratory Not Present- Bloody sputum, Chronic Cough, Difficulty Breathing, Snoring and Wheezing. Cardiovascular Not Present- Chest Pain, Difficulty Breathing Lying Down, Leg Cramps, Palpitations, Rapid Heart Rate, Shortness of Breath and Swelling of Extremities. All other systems negative  Vitals (Steve A. Brown [name]; 04/30/2020  1:23 PM) 04/30/2020 1:22 PM Weight: 193 lb Height: 71in Body Surface Area: 2.08 m Body Mass Index: 26.92 kg/m  Temp.: 21F  Pulse: 90 (Regular)  BP: 124/86(Sitting, Left Arm, Standard)        Physical Exam Steve Hiss M. Jacari Iannello MD; 04/30/2020 1:39 PM)  General Mental Status-Alert. General Appearance-Consistent with stated age. Hydration-Well hydrated. Voice-Normal.  Integumentary Note: On his upper back slightly to the right there is a well-circumscribed soft tissue mass. Nontender. Mobile. No overlying skin changes. Measures 10 x 12 cm.  Head and Neck Head-normocephalic, atraumatic with no lesions or palpable masses. Trachea-midline. Thyroid Gland Characteristics - normal size and consistency.  Eye Eyeball - Bilateral-Extraocular movements intact. Sclera/Conjunctiva - Bilateral-No scleral icterus.  Chest and Lung Exam Chest and lung exam reveals -quiet, even and easy respiratory effort with no use of accessory muscles and on auscultation, normal breath sounds, no adventitious sounds and normal vocal resonance. Inspection Chest Wall - Normal. Back - normal.  Breast - Did not examine.  Cardiovascular Cardiovascular examination reveals -normal heart sounds, regular rate and rhythm with no murmurs and normal pedal pulses bilaterally.  Abdomen Inspection Inspection of the abdomen reveals - No Hernias. Skin - Scar - no surgical scars. Palpation/Percussion Palpation and Percussion of the abdomen reveal - Soft, Non Tender, No Rebound tenderness, No Rigidity (guarding) and No hepatosplenomegaly. Auscultation Auscultation of the abdomen reveals - Bowel sounds normal.  Peripheral Vascular Upper Extremity Palpation - Pulses bilaterally normal.  Neurologic Neurologic evaluation reveals -alert and oriented x 3 with no impairment of recent or remote memory. Mental Status-Normal.  Neuropsychiatric The patient's mood and affect are described  as -normal. Judgment and Insight-insight is appropriate concerning matters relevant to self.  Musculoskeletal Normal Exam - Left-Upper  Extremity Strength Normal and Lower Extremity Strength Normal. Normal Exam - Right-Upper Extremity Strength Normal and Lower Extremity Strength Normal.  Lymphatic Head & Neck  General Head & Neck Lymphatics: Bilateral - Description - Normal. Axillary -Note:No lymphadenopathy.  Femoral & Inguinal - Did not examine.    Assessment & Plan Steve Hiss M. Steve Latendresse MD; 04/30/2020 1:39 PM)  LIPOMA OF BACK (D17.1) Impression: We discussed the etiology and management of lipomas. The patient was given educational material. We discussed that the majority of lipomas are benign although on a rare occasion it can be malignant.  We discussed observation versus surgical excision. We discussed the risks and benefits of surgery including but not limited to bleeding, infection, injury to surrounding structures, scarring, cosmetic concerns, seroma/hematoma, possible temporary drain placement, blood clot formation, anesthesia issues, possible recurrence, and the typical postoperative course.  The patient is leaning toward surgery but wants to think about it. he was instructed to call office with his decision and/or addl questions.  We discussed performing a procedure under straight local versus conscious sedation. We discussed the pros and cons of each approach. He lives independently. He has no immediate family. He is not sure that he could get a neighbor to stay with him so therefore recommended local  This patient encounter took 30 minutes today to perform the following: take history, perform exam, review outside records, interpret imaging, counsel the patient on their diagnosis and document encounter, findings & plan in the EHR  Current Plans Pt Education - [facility] Education - Written Instructions given: discussed with patient and provided information.  [name]. Redmond Pulling,  MD, FACS General, Bariatric, & Minimally Invasive Surgery [facility], [place]

## 2020-05-07 DIAGNOSIS — I10 Essential (primary) hypertension: Secondary | ICD-10-CM | POA: Diagnosis not present

## 2020-05-08 ENCOUNTER — Ambulatory Visit: Payer: Medicare Other | Admitting: Cardiology

## 2020-05-14 ENCOUNTER — Other Ambulatory Visit: Payer: Self-pay

## 2020-05-14 ENCOUNTER — Ambulatory Visit: Payer: Medicare Other | Admitting: Cardiology

## 2020-05-14 ENCOUNTER — Encounter: Payer: Self-pay | Admitting: Cardiology

## 2020-05-14 VITALS — BP 149/81 | HR 66 | Resp 16 | Ht 74.0 in | Wt 195.0 lb

## 2020-05-14 DIAGNOSIS — I471 Supraventricular tachycardia, unspecified: Secondary | ICD-10-CM

## 2020-05-14 DIAGNOSIS — I480 Paroxysmal atrial fibrillation: Secondary | ICD-10-CM

## 2020-05-14 DIAGNOSIS — I10 Essential (primary) hypertension: Secondary | ICD-10-CM

## 2020-05-14 MED ORDER — METOPROLOL SUCCINATE ER 100 MG PO TB24: 100.0000 mg | ORAL_TABLET | Freq: Every day | ORAL | 3 refills | Status: DC

## 2020-05-14 NOTE — Progress Notes (Signed)
Patient referred by [name], MD for dyspnea on exertion, syncope.   Subjective:   Steve Blankenship, male    DOB: [birth year], 86 y.o.   MRN: [id number]   Chief Complaint  Patient presents with   Hypertension   PSVT   Follow-up    6 month     86 y.o. Caucasian male with hypertension, hyperlipidemia, h/o spontaneous rupture of esophagus (2010), paroxysmal Afib, PSVT, h/o syncope.  His stated A. fib episode was in 2011, after his esophageal surgery.  There is no documented evidence of A. fib since then.  He has not been on anticoagulation before.  He underwent event monitor through his PCP in 2020, which showed brief episodes of SVT.  Patient denies any palpitations.  He has not had any subsequent syncope episodes. Blood pressure is well controlled on home checks.    Current Outpatient Medications on File Prior to Visit  Medication Sig Dispense Refill   Boswellia-Glucosamine-Vit D (OSTEO BI-FLEX ONE PER DAY PO) Take by mouth.     co-enzyme Q-10 30 MG capsule Take 30 mg by mouth 3 (three) times daily.     losartan (COZAAR) 100 MG tablet Take 100 mg by mouth daily.     metoprolol tartrate (LOPRESSOR) 50 MG tablet Take 1 tablet (50 mg total) by mouth 2 (two) times daily. 180 tablet 3   naproxen sodium (ALEVE) 220 MG tablet Take 220 mg by mouth.     neomycin-polymyxin-hydrocortisone (CORTISPORIN) otic solution Place 3 drops into the left ear 3 (three) times daily. 10 mL 0   psyllium (METAMUCIL SMOOTH TEXTURE) 28 % packet Take 1 packet by mouth 2 (two) times daily.     rosuvastatin (CRESTOR) 20 MG tablet Take 20 mg by mouth daily.     No current facility-administered medications on file prior to visit.    Cardiovascular studies:  EKG 05/14/2020: Sinus rhythm 65 bpm with sinus arrhytmia Cannot exclude old anteroseptal infarct Poor R wave progression  Event monitor 05/07/2019 - 05/21/2019: Dominant rhythm: Sinus. HR 41-218 bpm. Avg HR 65 bpm. <1% SVT burden,  fastest at 218 bpm for 6 beats.  No atrial fibrillation/atrial flutter/SVT/VT/high grade AV block, sinus pause >3sec noted. Symptoms reported: None Symptoms correlated with .  Outside echocardiogram 04/10/2019: LV size.  Mildly increased thickness. EF 55-60%.  Normal diastolic function. Normal RV size and function. Mild left atrial dilatation. Aortic valve sclerosis without stenosis. Trace mitral, mild tricuspid regurgitation.  RVSP 20-30 mmHg. Aortic root normal.  Chest x-ray 04/04/2019: No active findings identified.  Chronic appearing pleural and parenchymal scarring in both lower chest regions.  Left worse than right with old healed rib fractures on the left indicating previous chest trauma.  Recent labs: 04/01/2019: Glucose 104. BUN/Cr 11/0.72. eGFR 86. Na/K 140/4.3. Rest of the CMP normal. H/H 15/44. MCV 93. Plateletes 182. Chol 149, TG 104, HDL 58, LDL 70.   Review of Systems  Constitutional: Positive for malaise/fatigue.  Cardiovascular: Negative for chest pain, dyspnea on exertion, leg swelling, palpitations and syncope.  Musculoskeletal: Positive for joint pain.         Vitals:   05/14/20 1453  BP: (!) 149/81  Pulse: 66  Resp: 16  SpO2: 100%     Body mass index is 25.04 kg/m. Filed Weights   05/14/20 1453  Weight: 195 lb (88.5 kg)     Objective:   Physical Exam Vitals and nursing note reviewed.  Constitutional:      Appearance: He is well-developed.  Neck:  Vascular: No JVD.  Cardiovascular:     Rate and Rhythm: Normal rate and regular rhythm.     Pulses: Intact distal pulses.     Heart sounds: Normal heart sounds. No murmur heard.   Pulmonary:     Effort: Pulmonary effort is normal.     Breath sounds: Normal breath sounds. No wheezing or rales.           Assessment & Recommendations:   86 y.o. Caucasian male with hypertension, hyperlipidemia, h/o spontaneous rupture of esophagus (2010), paroxysmal Afib, PSVT, h/o  syncope.  PSVT: Essentially asymptomatic, with low PSVT burden. Switch metoprolol tartarate 50 mg bid to succinate 100 mg at night, for ease of administration.  Paroxysmal A. Fib: Documented in 2011 after his esophageal rupture surgery, with no recurrence.  He has not been on anticoagulation all this while.  There has been no new evidence of paroxysmal A. fib.  Thus, we have mutually decided to hold off on anticoagulation.  Hypertension:  Well controlled on home checks.  F/u in 6 months.  Nigel Mormon, MD [facility]. [place] Pager: [phone] Office: [phone] If no answer Cell [phone]

## 2020-05-27 DIAGNOSIS — H5203 Hypermetropia, bilateral: Secondary | ICD-10-CM | POA: Diagnosis not present

## 2020-05-27 DIAGNOSIS — H2513 Age-related nuclear cataract, bilateral: Secondary | ICD-10-CM | POA: Diagnosis not present

## 2020-05-27 DIAGNOSIS — H52223 Regular astigmatism, bilateral: Secondary | ICD-10-CM | POA: Diagnosis not present

## 2020-06-07 DIAGNOSIS — I10 Essential (primary) hypertension: Secondary | ICD-10-CM | POA: Diagnosis not present

## 2020-07-07 DIAGNOSIS — I10 Essential (primary) hypertension: Secondary | ICD-10-CM | POA: Diagnosis not present

## 2020-07-25 DIAGNOSIS — Z20822 Contact with and (suspected) exposure to covid-19: Secondary | ICD-10-CM | POA: Diagnosis not present

## 2020-10-08 DIAGNOSIS — I10 Essential (primary) hypertension: Secondary | ICD-10-CM | POA: Diagnosis not present

## 2020-11-07 DIAGNOSIS — I10 Essential (primary) hypertension: Secondary | ICD-10-CM | POA: Diagnosis not present

## 2020-11-13 ENCOUNTER — Other Ambulatory Visit: Payer: Self-pay

## 2020-11-13 ENCOUNTER — Ambulatory Visit: Payer: Medicare Other | Admitting: Cardiology

## 2020-11-13 ENCOUNTER — Encounter: Payer: Self-pay | Admitting: Cardiology

## 2020-11-13 VITALS — BP 156/84 | HR 73 | Temp 98.7°F | Resp 16 | Ht 74.0 in | Wt 195.2 lb

## 2020-11-13 DIAGNOSIS — I471 Supraventricular tachycardia, unspecified: Secondary | ICD-10-CM

## 2020-11-13 DIAGNOSIS — I10 Essential (primary) hypertension: Secondary | ICD-10-CM | POA: Diagnosis not present
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
## 2021-04-13 DIAGNOSIS — I10 Essential (primary) hypertension: Secondary | ICD-10-CM | POA: Diagnosis not present

## 2021-04-15 DIAGNOSIS — Z8679 Personal history of other diseases of the circulatory system: Secondary | ICD-10-CM | POA: Diagnosis not present

## 2021-04-15 DIAGNOSIS — D692 Other nonthrombocytopenic purpura: Secondary | ICD-10-CM | POA: Diagnosis not present

## 2021-04-15 DIAGNOSIS — E7800 Pure hypercholesterolemia, unspecified: Secondary | ICD-10-CM | POA: Diagnosis not present

## 2021-04-15 DIAGNOSIS — D229 Melanocytic nevi, unspecified: Secondary | ICD-10-CM | POA: Diagnosis not present

## 2021-04-15 DIAGNOSIS — Z0000 Encounter for general adult medical examination without abnormal findings: Secondary | ICD-10-CM | POA: Diagnosis not present

## 2021-04-15 DIAGNOSIS — Z8719 Personal history of other diseases of the digestive system: Secondary | ICD-10-CM | POA: Diagnosis not present

## 2021-04-15 DIAGNOSIS — I10 Essential (primary) hypertension: Secondary | ICD-10-CM | POA: Diagnosis not present

## 2021-06-10 ENCOUNTER — Other Ambulatory Visit: Payer: Self-pay | Admitting: Cardiology

## 2021-06-10 DIAGNOSIS — I471 Supraventricular tachycardia: Secondary | ICD-10-CM

## 2021-09-05 ENCOUNTER — Other Ambulatory Visit: Payer: Self-pay | Admitting: Cardiology

## 2021-09-05 DIAGNOSIS — I471 Supraventricular tachycardia: Secondary | ICD-10-CM

## 2021-11-12 ENCOUNTER — Ambulatory Visit: Payer: Medicare Other | Admitting: Cardiology

## 2021-12-04 ENCOUNTER — Other Ambulatory Visit: Payer: Self-pay | Admitting: Cardiology

## 2021-12-04 DIAGNOSIS — I471 Supraventricular tachycardia: Secondary | ICD-10-CM

## 2021-12-07 ENCOUNTER — Other Ambulatory Visit: Payer: Self-pay

## 2021-12-07 DIAGNOSIS — I471 Supraventricular tachycardia, unspecified: Secondary | ICD-10-CM

## 2021-12-07 MED ORDER — METOPROLOL SUCCINATE ER 100 MG PO TB24: 100.0000 mg | ORAL_TABLET | Freq: Every day | ORAL | 0 refills | Status: DC

## 2022-03-09 ENCOUNTER — Other Ambulatory Visit: Payer: Self-pay | Admitting: Cardiology

## 2022-03-09 DIAGNOSIS — I471 Supraventricular tachycardia: Secondary | ICD-10-CM

## 2022-03-11 ENCOUNTER — Other Ambulatory Visit: Payer: Self-pay | Admitting: Cardiology

## 2022-03-11 ENCOUNTER — Other Ambulatory Visit: Payer: Self-pay

## 2022-03-11 DIAGNOSIS — I471 Supraventricular tachycardia: Secondary | ICD-10-CM

## 2022-03-11 MED ORDER — LOSARTAN POTASSIUM 100 MG PO TABS: 100.0000 mg | ORAL_TABLET | Freq: Every day | ORAL | 0 refills | Status: DC

## 2022-03-15 ENCOUNTER — Other Ambulatory Visit: Payer: Self-pay | Admitting: Cardiology

## 2022-03-15 DIAGNOSIS — I471 Supraventricular tachycardia: Secondary | ICD-10-CM

## 2022-04-07 ENCOUNTER — Ambulatory Visit: Payer: Medicare Other | Admitting: Cardiology

## 2022-06-09 ENCOUNTER — Other Ambulatory Visit: Payer: Self-pay | Admitting: Cardiology

## 2022-06-09 DIAGNOSIS — I471 Supraventricular tachycardia, unspecified: Secondary | ICD-10-CM

## 2022-06-10 ENCOUNTER — Other Ambulatory Visit: Payer: Self-pay

## 2022-06-10 DIAGNOSIS — I471 Supraventricular tachycardia, unspecified: Secondary | ICD-10-CM

## 2022-06-10 MED ORDER — LOSARTAN POTASSIUM 100 MG PO TABS: 100.0000 mg | ORAL_TABLET | Freq: Every day | ORAL | 3 refills | Status: AC

## 2022-06-10 MED ORDER — METOPROLOL SUCCINATE ER 100 MG PO TB24: ORAL_TABLET | ORAL | 3 refills | Status: AC

## 2023-05-17 ENCOUNTER — Emergency Department (HOSPITAL_COMMUNITY): Payer: Medicare Other

## 2023-05-17 ENCOUNTER — Other Ambulatory Visit: Payer: Self-pay

## 2023-05-17 ENCOUNTER — Emergency Department (HOSPITAL_COMMUNITY)
Admission: EM | Admit: 2023-05-17 | Discharge: 2023-06-09 | Disposition: E | Payer: Medicare Other | Attending: Surgery | Admitting: Surgery

## 2023-05-17 ENCOUNTER — Encounter (HOSPITAL_COMMUNITY): Payer: Self-pay

## 2023-05-17 DIAGNOSIS — X72XXXA Intentional self-harm by handgun discharge, initial encounter: Secondary | ICD-10-CM | POA: Insufficient documentation

## 2023-05-17 DIAGNOSIS — S0193XA Puncture wound without foreign body of unspecified part of head, initial encounter: Secondary | ICD-10-CM | POA: Diagnosis not present

## 2023-05-17 DIAGNOSIS — M8580 Other specified disorders of bone density and structure, unspecified site: Secondary | ICD-10-CM | POA: Diagnosis not present

## 2023-05-17 DIAGNOSIS — M8588 Other specified disorders of bone density and structure, other site: Secondary | ICD-10-CM | POA: Diagnosis not present

## 2023-05-17 DIAGNOSIS — R6889 Other general symptoms and signs: Secondary | ICD-10-CM | POA: Diagnosis not present

## 2023-05-17 DIAGNOSIS — I468 Cardiac arrest due to other underlying condition: Secondary | ICD-10-CM | POA: Diagnosis not present

## 2023-05-17 DIAGNOSIS — S0180XA Unspecified open wound of other part of head, initial encounter: Secondary | ICD-10-CM | POA: Insufficient documentation

## 2023-05-17 DIAGNOSIS — M25559 Pain in unspecified hip: Secondary | ICD-10-CM | POA: Diagnosis not present

## 2023-05-17 DIAGNOSIS — S3993XA Unspecified injury of pelvis, initial encounter: Secondary | ICD-10-CM | POA: Diagnosis not present

## 2023-05-17 DIAGNOSIS — I611 Nontraumatic intracerebral hemorrhage in hemisphere, cortical: Secondary | ICD-10-CM | POA: Diagnosis not present

## 2023-05-17 DIAGNOSIS — S2242XA Multiple fractures of ribs, left side, initial encounter for closed fracture: Secondary | ICD-10-CM | POA: Insufficient documentation

## 2023-05-17 DIAGNOSIS — I499 Cardiac arrhythmia, unspecified: Secondary | ICD-10-CM | POA: Diagnosis not present

## 2023-05-17 DIAGNOSIS — Z743 Need for continuous supervision: Secondary | ICD-10-CM | POA: Diagnosis not present

## 2023-05-17 DIAGNOSIS — R000 Tachycardia, unspecified: Secondary | ICD-10-CM | POA: Insufficient documentation

## 2023-05-17 DIAGNOSIS — S0990XA Unspecified injury of head, initial encounter: Secondary | ICD-10-CM | POA: Diagnosis present

## 2023-05-17 DIAGNOSIS — S02119A Unspecified fracture of occiput, initial encounter for closed fracture: Secondary | ICD-10-CM | POA: Diagnosis not present

## 2023-05-17 DIAGNOSIS — R404 Transient alteration of awareness: Secondary | ICD-10-CM | POA: Diagnosis not present

## 2023-05-17 DIAGNOSIS — S14151A Other incomplete lesion at C1 level of cervical spinal cord, initial encounter: Secondary | ICD-10-CM | POA: Insufficient documentation

## 2023-05-17 DIAGNOSIS — S020XXB Fracture of vault of skull, initial encounter for open fracture: Secondary | ICD-10-CM | POA: Diagnosis not present

## 2023-05-17 DIAGNOSIS — Z4682 Encounter for fitting and adjustment of non-vascular catheter: Secondary | ICD-10-CM | POA: Diagnosis not present

## 2023-05-17 DIAGNOSIS — S0993XA Unspecified injury of face, initial encounter: Secondary | ICD-10-CM | POA: Diagnosis not present

## 2023-05-17 LAB — I-STAT CHEM 8, ED
BUN: 13 mg/dL (ref 8–23)
Calcium, Ion: 0.87 mmol/L — CL (ref 1.15–1.40)
Chloride: 100 mmol/L (ref 98–111)
Creatinine, Ser: 0.9 mg/dL (ref 0.61–1.24)
Glucose, Bld: 168 mg/dL — ABNORMAL HIGH (ref 70–99)
HCT: 35 % — ABNORMAL LOW (ref 39.0–52.0)
Hemoglobin: 11.9 g/dL — ABNORMAL LOW (ref 13.0–17.0)
Potassium: 4.6 mmol/L (ref 3.5–5.1)
Sodium: 133 mmol/L — ABNORMAL LOW (ref 135–145)
TCO2: 22 mmol/L (ref 22–32)

## 2023-05-17 LAB — PREPARE RBC (CROSSMATCH)

## 2023-05-17 LAB — I-STAT CG4 LACTIC ACID, ED: Lactic Acid, Venous: 5.9 mmol/L (ref 0.5–1.9)

## 2023-05-17 MED ORDER — SODIUM BICARBONATE 8.4 % IV SOLN
INTRAVENOUS | Status: AC | PRN
Start: 2023-05-17 — End: 2023-05-17
  Administered 2023-05-17: 50 meq via INTRAVENOUS

## 2023-05-17 MED ORDER — SUCCINYLCHOLINE CHLORIDE 20 MG/ML IJ SOLN
INTRAMUSCULAR | Status: AC | PRN
Start: 2023-05-17 — End: 2023-05-17
  Administered 2023-05-17: 100 mg via INTRAVENOUS

## 2023-05-17 MED ORDER — EPINEPHRINE 1 MG/10ML IJ SOSY
PREFILLED_SYRINGE | INTRAMUSCULAR | Status: AC | PRN
Start: 2023-05-17 — End: 2023-05-17
  Administered 2023-05-17 (×3): 1 mg via INTRAVENOUS

## 2023-05-17 MED ORDER — AMIODARONE IV BOLUS ONLY 150 MG/100ML
INTRAVENOUS | Status: AC | PRN
Start: 2023-05-17 — End: 2023-05-17
  Administered 2023-05-17: 150 mg via INTRAVENOUS

## 2023-05-17 MED ORDER — ETOMIDATE 2 MG/ML IV SOLN
INTRAVENOUS | Status: AC | PRN
Start: 2023-05-17 — End: 2023-05-17
  Administered 2023-05-17: 20 mg via INTRAVENOUS

## 2023-05-17 MED ORDER — NOREPINEPHRINE 4 MG/250ML-% IV SOLN
0.0000 ug/min | INTRAVENOUS | Status: DC
  Administered 2023-05-17: 5 ug/min via INTRAVENOUS

## 2023-05-17 MED ORDER — SODIUM CHLORIDE 0.9% IV SOLUTION: Freq: Once | INTRAVENOUS | Status: DC
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
## 2023-05-17 NOTE — ED Notes (Signed)
Rhythm change- CPR started
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
## 2023-05-17 NOTE — ED Notes (Addendum)
Time of death. Lovick at bedside

## 2023-05-17 NOTE — Progress Notes (Signed)
Orthopedic Tech Progress Note Patient Details:  Steve Blankenship 1934/01/19 [id number]  Patient ID: [name], male   DOB: 07-Jun-1934, 89 y.o.   MRN: [id number] I attended trauma page. Trinna Post 05/20/23, 6:28 PM

## 2023-05-17 NOTE — ED Notes (Signed)
1556- epi given 1557- pulse check, pt has pulse

## 2023-05-18 ENCOUNTER — Encounter: Payer: Self-pay | Admitting: Cardiology

## 2023-05-18 LAB — BPAM FFP
Blood Product Expiration Date: 202410112359
Blood Product Expiration Date: 202410132359
ISSUE DATE / TIME: 202410091612
ISSUE DATE / TIME: 202410091612
Unit Type and Rh: 6200
Unit Type and Rh: 6200

## 2023-05-18 LAB — PREPARE FRESH FROZEN PLASMA: Unit division: 0

## 2023-05-18 LAB — TYPE AND SCREEN
Unit division: 0
Unit division: 0

## 2023-05-18 LAB — BPAM RBC
Blood Product Expiration Date: 202411112359
Blood Product Expiration Date: 202411112359
ISSUE DATE / TIME: 202410091557
ISSUE DATE / TIME: 202410091611
Unit Type and Rh: 5100
Unit Type and Rh: 5100

## 2023-06-09 DEATH — deceased
